# Patient Record
Sex: Male | Born: 1995 | Race: Black or African American | Hispanic: No | Marital: Single | Smoking: Current every day smoker
Health system: Southern US, Community
[De-identification: ages and names within clinical notes are randomized; demographics above are authoritative.]

## PROBLEM LIST (undated history)

## (undated) DIAGNOSIS — J45909 Unspecified asthma, uncomplicated: Secondary | ICD-10-CM

## (undated) HISTORY — PX: OTHER SURGICAL HISTORY: SHX169

## (undated) HISTORY — DX: Unspecified asthma, uncomplicated: J45.909

## (undated) HISTORY — PX: TONSILLECTOMY: SUR1361

---
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
## 2018-04-12 ENCOUNTER — Encounter (HOSPITAL_COMMUNITY): Payer: Self-pay

## 2018-04-12 ENCOUNTER — Ambulatory Visit (HOSPITAL_COMMUNITY)
Admission: EM | Admit: 2018-04-12 | Discharge: 2018-04-12 | Disposition: A | Discharge status: Home / Self Care | Payer: Self-pay | Attending: Family Medicine | Admitting: Family Medicine

## 2018-04-12 DIAGNOSIS — B353 Tinea pedis: Secondary | ICD-10-CM

## 2018-04-12 DIAGNOSIS — L03031 Cellulitis of right toe: Secondary | ICD-10-CM

## 2018-04-12 MED ORDER — CLOTRIMAZOLE 1 % EX CREA: TOPICAL_CREAM | CUTANEOUS | 0 refills | Status: DC

## 2018-04-12 MED ORDER — CEPHALEXIN 500 MG PO CAPS: 500.0000 mg | ORAL_CAPSULE | Freq: Four times a day (QID) | ORAL | 0 refills | Status: DC

## 2018-04-12 NOTE — ED Provider Notes (Signed)
[facility]    CSN: [id number] Arrival date & time: 04/12/18  4098     History   Chief Complaint Chief Complaint  Patient presents with  . Foot Pain    right foot    HPI Calvin Cummings is a 22 y.o. male.   Patient is a 22 year old male with no significant medical history.  He presents with 4 days of right great toe pain, redness, swelling and itchy rash to plantar aspect of foot and in between toes.  He reports a history of athlete's foot.  He has not been using any medication for the rash.  He denies any fever, chills, joint aches.  ROS per HPI      Past Medical History:  Diagnosis Date  . Asthma     Patient Active Problem List   Diagnosis Date Noted  . TINEA VERSICOLOR 06/02/2010  . ASTHMA, UNSPECIFIED 11/29/2006  . ECZEMA, ATOPIC DERMATITIS 11/29/2006    Past Surgical History:  Procedure Laterality Date  . arm fracture  Left   . FOREIGN BODY REMOVAL  08/24/2012   Procedure: REMOVAL FOREIGN BODY EXTREMITY;  Surgeon: Dominica Severin, MD;  Location: [facility] OR;  Service: Orthopedics;  Laterality: Right;  . TONSILLECTOMY         Home Medications    Prior to Admission medications   Medication Sig Start Date End Date Taking? Authorizing Provider  albuterol (PROVENTIL HFA;VENTOLIN HFA) 108 (90 BASE) MCG/ACT inhaler Inhale 2 puffs into the lungs every 6 (six) hours as needed for wheezing or shortness of breath.    [provider]  cephALEXin (KEFLEX) 500 MG capsule Take 1 capsule (500 mg total) by mouth 4 (four) times daily. 04/12/18   Dahlia Byes A, NP  clotrimazole (LOTRIMIN) 1 % cream Apply to affected area 2 times daily 04/12/18   Janace Aris, NP    Family History Family History  Problem Relation Age of Onset  . Asthma Mother   . Cancer Maternal Grandmother        breast  . Hypertension Maternal Grandfather     Social History Social History   Tobacco Use  . Smoking status: Current Every Day Smoker  . Smokeless tobacco: Never Used    Substance Use Topics  . Alcohol use: No  . Drug use: No     Allergies   Patient has no known allergies.   Review of Systems Review of Systems   Physical Exam Triage Vital Signs ED Triage Vitals  Enc Vitals Group     BP 04/12/18 0827 (!) 125/59     Pulse Rate 04/12/18 0827 62     Resp 04/12/18 0827 16     Temp 04/12/18 0827 98.4 F (36.9 C)     Temp Source 04/12/18 0827 Oral     SpO2 04/12/18 0827 99 %     Weight --      Height --      Head Circumference --      Peak Flow --      Pain Score 04/12/18 0829 8     Pain Loc --      Pain Edu? --      Excl. in GC? --    No data found.  Updated Vital Signs BP (!) 125/59 (BP Location: Right Arm)   Pulse 62   Temp 98.4 F (36.9 C) (Oral)   Resp 16   SpO2 99%   Visual Acuity Right Eye Distance:   Left Eye Distance:   Bilateral Distance:  Right Eye Near:   Left Eye Near:    Bilateral Near:     Physical Exam  Constitutional: He is oriented to person, place, and time. He appears well-developed and well-nourished.  HENT:  Head: Normocephalic.  Pulmonary/Chest: Effort normal.  Neurological: He is alert and oriented to person, place, and time.  Skin: Skin is warm and dry. Capillary refill takes less than 2 seconds.  Significant swelling, erythema, tenderness to right great toe. Scaly, circular rash to plantar aspect of foot, and in between toes on right foot with multiple cracks in foot.   Psychiatric: He has a normal mood and affect.  Nursing note and vitals reviewed.    UC Treatments / Results  Labs (all labs ordered are listed, but only abnormal results are displayed) Labs Reviewed - No data to display  EKG None  Radiology No results found.  Procedures Procedures (including critical care time)  Medications Ordered in UC Medications - No data to display  Initial Impression / Assessment and Plan / UC Course  I have reviewed the triage vital signs and the nursing notes.  Pertinent labs & imaging  results that were available during my care of the patient were reviewed by me and considered in my medical decision making (see chart for details).     Most likely fungal infection (athlete's foot).  I believe this has caused cellulitis infection in the right great toe.  Multiple cracks in feet were infection can enter.  Treating with fungal cream and antibiotics.  Return precautions given.  Final Clinical Impressions(s) / UC Diagnoses   Final diagnoses:  Athlete's foot on right  Cellulitis of toe of right foot     Discharge Instructions     It was nice meeting you!!  I am treating you for athletes foot and an infection in your toe. Please take the medication as prescribed.  Follow up for worsening symptoms.     ED Prescriptions    Medication Sig Dispense Auth. Provider   clotrimazole (LOTRIMIN) 1 % cream Apply to affected area 2 times daily 15 g Roxas Clymer A, NP   cephALEXin (KEFLEX) 500 MG capsule Take 1 capsule (500 mg total) by mouth 4 (four) times daily. 40 capsule Dahlia ByesBast, Naara Kelty A, NP     Controlled Substance Prescriptions [place] Controlled Substance Registry consulted? Not Applicable   Janace ArisBast, Dyllon Henken A, NP 04/12/18 1048

## 2018-04-12 NOTE — Discharge Instructions (Signed)
It was nice meeting you!!  I am treating you for athletes foot and an infection in your toe. Please take the medication as prescribed.  Follow up for worsening symptoms.

## 2018-04-12 NOTE — ED Triage Notes (Signed)
Pt presents with right foot pain

## 2020-02-20 ENCOUNTER — Emergency Department (HOSPITAL_COMMUNITY)
Admission: EM | Admit: 2020-02-20 | Discharge: 2020-02-20 | Disposition: A | Discharge status: Home / Self Care | Payer: Self-pay | Attending: Emergency Medicine | Admitting: Emergency Medicine

## 2020-02-20 ENCOUNTER — Encounter (HOSPITAL_COMMUNITY): Payer: Self-pay | Admitting: *Deleted

## 2020-02-20 ENCOUNTER — Emergency Department (HOSPITAL_COMMUNITY): Payer: Self-pay

## 2020-02-20 DIAGNOSIS — R1032 Left lower quadrant pain: Secondary | ICD-10-CM | POA: Insufficient documentation

## 2020-02-20 DIAGNOSIS — R1030 Lower abdominal pain, unspecified: Secondary | ICD-10-CM | POA: Insufficient documentation

## 2020-02-20 DIAGNOSIS — F17200 Nicotine dependence, unspecified, uncomplicated: Secondary | ICD-10-CM | POA: Insufficient documentation

## 2020-02-20 DIAGNOSIS — J45909 Unspecified asthma, uncomplicated: Secondary | ICD-10-CM | POA: Insufficient documentation

## 2020-02-20 LAB — CBC
HCT: 44.4 % (ref 39.0–52.0)
Hemoglobin: 14.3 g/dL (ref 13.0–17.0)
MCH: 27.6 pg (ref 26.0–34.0)
MCHC: 32.2 g/dL (ref 30.0–36.0)
MCV: 85.5 fL (ref 80.0–100.0)
Platelets: 287 10*3/uL (ref 150–400)
RBC: 5.19 MIL/uL (ref 4.22–5.81)
RDW: 13.7 % (ref 11.5–15.5)
WBC: 5 10*3/uL (ref 4.0–10.5)
nRBC: 0 % (ref 0.0–0.2)

## 2020-02-20 LAB — COMPREHENSIVE METABOLIC PANEL
ALT: 10 U/L (ref 0–44)
AST: 17 U/L (ref 15–41)
Albumin: 4.2 g/dL (ref 3.5–5.0)
Alkaline Phosphatase: 43 U/L (ref 38–126)
Anion gap: 9 (ref 5–15)
BUN: 7 mg/dL (ref 6–20)
CO2: 27 mmol/L (ref 22–32)
Calcium: 9.3 mg/dL (ref 8.9–10.3)
Chloride: 103 mmol/L (ref 98–111)
Creatinine, Ser: 1.19 mg/dL (ref 0.61–1.24)
GFR calc Af Amer: >60 mL/min (ref >60)
GFR calc non Af Amer: >60 mL/min (ref >60)
Glucose, Bld: 90 mg/dL (ref 70–99)
Potassium: 4 mmol/L (ref 3.5–5.1)
Sodium: 139 mmol/L (ref 135–145)
Total Bilirubin: 0.5 mg/dL (ref 0.3–1.2)
Total Protein: 6.9 g/dL (ref 6.5–8.1)

## 2020-02-20 LAB — URINALYSIS, ROUTINE W REFLEX MICROSCOPIC
Bilirubin Urine: NEGATIVE
Glucose, UA: NEGATIVE mg/dL
Hgb urine dipstick: NEGATIVE
Ketones, ur: NEGATIVE mg/dL
Nitrite: NEGATIVE
Protein, ur: NEGATIVE mg/dL
Specific Gravity, Urine: 1.02 (ref 1.005–1.030)
WBC, UA: >50 WBC/hpf — ABNORMAL HIGH (ref 0–5)
pH: 6 (ref 5.0–8.0)

## 2020-02-20 LAB — LIPASE, BLOOD: Lipase: 23 U/L (ref 11–51)

## 2020-02-20 MED ORDER — ONDANSETRON HCL 4 MG/2ML IJ SOLN
4.0000 mg | Freq: Once | INTRAMUSCULAR | Status: AC
  Administered 2020-02-20: 4 mg via INTRAVENOUS
  Filled 2020-02-20: qty 2

## 2020-02-20 MED ORDER — IOHEXOL 300 MG/ML  SOLN
100.0000 mL | Freq: Once | INTRAMUSCULAR | Status: AC | PRN
  Administered 2020-02-20: 100 mL via INTRAVENOUS

## 2020-02-20 MED ORDER — CEPHALEXIN 500 MG PO CAPS
500.0000 mg | ORAL_CAPSULE | Freq: Four times a day (QID) | ORAL | 0 refills | Status: DC
Start: 2020-02-20 — End: 2021-06-02

## 2020-02-20 MED ORDER — SODIUM CHLORIDE 0.9 % IV BOLUS
500.0000 mL | Freq: Once | INTRAVENOUS | Status: AC
  Administered 2020-02-20: 500 mL via INTRAVENOUS

## 2020-02-20 MED ORDER — HYDROMORPHONE HCL 1 MG/ML IJ SOLN
1.0000 mg | Freq: Once | INTRAMUSCULAR | Status: AC
  Administered 2020-02-20: 1 mg via INTRAVENOUS
  Filled 2020-02-20: qty 1

## 2020-02-20 MED ORDER — SODIUM CHLORIDE 0.9 % IV SOLN: INTRAVENOUS | Status: DC

## 2020-02-20 MED ORDER — HYDROCODONE-ACETAMINOPHEN 5-325 MG PO TABS: 1.0000 | ORAL_TABLET | Freq: Four times a day (QID) | ORAL | 0 refills | Status: DC | PRN

## 2020-02-20 MED ORDER — OXYCODONE-ACETAMINOPHEN 5-325 MG PO TABS
1.0000 | ORAL_TABLET | ORAL | Status: DC | PRN
  Administered 2020-02-20: 1 via ORAL
  Filled 2020-02-20: qty 1

## 2020-02-20 MED ORDER — SODIUM CHLORIDE 0.9% FLUSH: 3.0000 mL | Freq: Once | INTRAVENOUS | Status: DC

## 2020-02-20 NOTE — ED Provider Notes (Addendum)
[facility] EMERGENCY DEPARTMENT Provider Note   CSN: [id number] Arrival date & time: 02/20/20  0442     History Chief Complaint  Patient presents with  . Abdominal Pain    Calvin Cummings is a 24 y.o. male.  Patient with complaint of bilateral lower quadrant abdominal pain for 2 days.  Is been waxing and waning but has not gone away.  At times it is severe.  Not associated with any penile discharge or any dysuria or urinary frequency.  No nausea no vomiting no diarrhea.  Never had pain like this before.  No history of any injury.        Past Medical History:  Diagnosis Date  . Asthma     Patient Active Problem List   Diagnosis Date Noted  . TINEA VERSICOLOR 06/02/2010  . ASTHMA, UNSPECIFIED 11/29/2006  . ECZEMA, ATOPIC DERMATITIS 11/29/2006    Past Surgical History:  Procedure Laterality Date  . arm fracture  Left   . FOREIGN BODY REMOVAL  08/24/2012   Procedure: REMOVAL FOREIGN BODY EXTREMITY;  Surgeon: Roseanne Kaufman, MD;  Location: [place];  Service: Orthopedics;  Laterality: Right;  . TONSILLECTOMY         Family History  Problem Relation Age of Onset  . Asthma Mother   . Cancer Maternal Grandmother        breast  . Hypertension Maternal Grandfather     Social History   Tobacco Use  . Smoking status: Current Every Day Smoker  . Smokeless tobacco: Never Used  Substance Use Topics  . Alcohol use: No  . Drug use: No    Home Medications Prior to Admission medications   Medication Sig Start Date End Date Taking? Authorizing Provider  albuterol (PROVENTIL HFA;VENTOLIN HFA) 108 (90 BASE) MCG/ACT inhaler Inhale 2 puffs into the lungs every 6 (six) hours as needed for wheezing or shortness of breath.    [provider]  cephALEXin (KEFLEX) 500 MG capsule Take 1 capsule (500 mg total) by mouth 4 (four) times daily. Patient not taking: Reported on 02/20/2020 04/12/18   Loura Halt A, NP  clotrimazole (LOTRIMIN) 1 % cream Apply to  affected area 2 times daily Patient not taking: Reported on 02/20/2020 04/12/18   Loura Halt A, NP  HYDROcodone-acetaminophen (NORCO/VICODIN) 5-325 MG tablet Take 1 tablet by mouth every 6 (six) hours as needed for moderate pain. 02/20/20   Fredia Sorrow, MD    Allergies    Patient has no known allergies.  Review of Systems   Review of Systems  Constitutional: Negative for chills and fever.  HENT: Negative for congestion, rhinorrhea and sore throat.   Eyes: Negative for visual disturbance.  Respiratory: Negative for cough and shortness of breath.   Cardiovascular: Negative for chest pain and leg swelling.  Gastrointestinal: Positive for abdominal pain. Negative for diarrhea, nausea and vomiting.  Genitourinary: Negative for dysuria.  Musculoskeletal: Negative for back pain and neck pain.  Skin: Negative for rash.  Neurological: Negative for dizziness, light-headedness and headaches.  Hematological: Does not bruise/bleed easily.  Psychiatric/Behavioral: Negative for confusion.    Physical Exam Updated Vital Signs BP 124/75   Pulse (!) 41   Temp 98 F (36.7 C) (Oral)   Resp 14   Ht 1.676 m (5\' 6" )   Wt 65.8 kg   SpO2 100%   BMI 23.40 kg/m   Physical Exam Vitals and nursing note reviewed.  Constitutional:      Appearance: Normal appearance. He is well-developed.  HENT:     Head: Normocephalic and atraumatic.  Eyes:     Extraocular Movements: Extraocular movements intact.     Conjunctiva/sclera: Conjunctivae normal.     Pupils: Pupils are equal, round, and reactive to light.  Cardiovascular:     Rate and Rhythm: Normal rate and regular rhythm.     Heart sounds: No murmur.  Pulmonary:     Effort: Pulmonary effort is normal. No respiratory distress.     Breath sounds: Normal breath sounds.  Abdominal:     Palpations: Abdomen is soft.     Tenderness: There is abdominal tenderness. There is guarding.  Genitourinary:    Penis: Normal.   Musculoskeletal:         General: No swelling. Normal range of motion.     Cervical back: Normal range of motion and neck supple.  Skin:    General: Skin is warm and dry.  Neurological:     General: No focal deficit present.     Mental Status: He is alert and oriented to person, place, and time.     Cranial Nerves: No cranial nerve deficit.     Sensory: No sensory deficit.     Motor: No weakness.     ED Results / Procedures / Treatments   Labs (all labs ordered are listed, but only abnormal results are displayed) Labs Reviewed  URINALYSIS, ROUTINE W REFLEX MICROSCOPIC - Abnormal; Notable for the following components:      Result Value   APPearance HAZY (*)    Leukocytes,Ua MODERATE (*)    WBC, UA >50 (*)    Bacteria, UA RARE (*)    All other components within normal limits  LIPASE, BLOOD  COMPREHENSIVE METABOLIC PANEL  CBC    EKG None  Radiology CT Abdomen Pelvis W Contrast  Result Date: 02/20/2020 CLINICAL DATA:  Right lower quadrant abdominal pain. EXAM: CT ABDOMEN AND PELVIS WITH CONTRAST TECHNIQUE: Multidetector CT imaging of the abdomen and pelvis was performed using the standard protocol following bolus administration of intravenous contrast. CONTRAST:  OMNIPAQUE IOHEXOL 300 MG/ML  SOLN COMPARISON:  None. FINDINGS: Lower chest: Unremarkable. Hepatobiliary: No focal liver abnormality is seen. No gallstones, gallbladder wall thickening, or biliary dilatation. Pancreas: Unremarkable. No pancreatic ductal dilatation or surrounding inflammatory changes. Spleen: Normal in size without focal abnormality. Adrenals/Urinary Tract: Adrenal glands are unremarkable. Kidneys are normal, without renal calculi, focal lesion, or hydronephrosis. Bladder is unremarkable. Stomach/Bowel: Unremarkable stomach, small bowel and colon. No evidence of appendicitis. Vascular/Lymphatic: No significant vascular findings are present. No enlarged abdominal or pelvic lymph nodes. Reproductive: Prostate is unremarkable. Other:  No abdominal wall hernia or abnormality. No abdominopelvic ascites. Musculoskeletal: Normal appearing bones. IMPRESSION: Normal examination.  No evidence of appendicitis. Electronically Signed   By: Beckie Salts M.D.   On: 02/20/2020 09:12    Procedures Procedures (including critical care time)  Medications Ordered in ED Medications  sodium chloride flush (NS) 0.9 % injection 3 mL (3 mLs Intravenous Not Given 02/20/20 0819)  oxyCODONE-acetaminophen (PERCOCET/ROXICET) 5-325 MG per tablet 1 tablet (1 tablet Oral Given 02/20/20 0602)  0.9 %  sodium chloride infusion ( Intravenous New Bag/Given 02/20/20 1025)  sodium chloride 0.9 % bolus 500 mL (0 mLs Intravenous Stopped 02/20/20 1024)  ondansetron (ZOFRAN) injection 4 mg (4 mg Intravenous Given 02/20/20 0819)  HYDROmorphone (DILAUDID) injection 1 mg (1 mg Intravenous Given 02/20/20 0821)  iohexol (OMNIPAQUE) 300 MG/ML solution 100 mL (100 mLs Intravenous Contrast Given 02/20/20 0907)  HYDROmorphone (DILAUDID) injection 1  mg (1 mg Intravenous Given 02/20/20 1025)    ED Course  I have reviewed the triage vital signs and the nursing notes.  Pertinent labs & imaging results that were available during my care of the patient were reviewed by me and considered in my medical decision making (see chart for details).    MDM Rules/Calculators/A&P                      Patient's labs only significant for some white blood cells in the urine.  But no bacteria.  Nitrite negative.  Urine will be sent for culture.  No leukocytosis.  CT scan of the abdomen negative for any acute findings.  No penile discharge.  We will treat patient symptomatically with pain medication have him return if there is no improvement.  No acute findings despite his abdomen being fairly tender.  Since the white blood cell counts actually got greater than 50 but no bacteria we will go ahead and treat with Keflex empirically.  To see if he improves.   Final Clinical Impression(s) / ED  Diagnoses Final diagnoses:  Lower abdominal pain    Rx / DC Orders ED Discharge Orders         Ordered    HYDROcodone-acetaminophen (NORCO/VICODIN) 5-325 MG tablet  Every 6 hours PRN     02/20/20 1154           Vanetta Mulders, MD 02/20/20 1159    Vanetta Mulders, MD 02/20/20 1204

## 2020-02-20 NOTE — ED Notes (Signed)
Patient verbalizes understanding of discharge instructions. Opportunity for questioning and answers were provided. Pt discharged from ED. 

## 2020-02-20 NOTE — ED Triage Notes (Addendum)
To ED for eval of feeling like he has a knot in his abd. Describes as diffuse sharp pain. No vomiting. No diarrhea. Denies blood in stool. BMs normal per pt. Pt able to eat without increased pain. No pain with urination. States pain started 2 days - getting worse.

## 2020-02-20 NOTE — Discharge Instructions (Addendum)
Work-up for the abdominal pain without any significant findings.  Other than some white blood cells in the urine.  Urine been sent for culture if it grows bacteria suggestive of urinary tract infection you will be contacted.  CT scan of the abdomen without any significant findings inside the abdomen.  Take the pain medication as needed.  Return for any new or worse symptoms or if not improving over the next few days.  Take the antibiotic Keflex as directed for the next 7 days.

## 2020-02-21 LAB — URINE CULTURE: Culture: NO GROWTH

## 2020-10-10 ENCOUNTER — Emergency Department (HOSPITAL_COMMUNITY)
Admission: EM | Admit: 2020-10-10 | Discharge: 2020-10-10 | Disposition: A | Discharge status: Home / Self Care | Payer: Self-pay | Attending: Emergency Medicine | Admitting: Emergency Medicine

## 2020-10-10 ENCOUNTER — Other Ambulatory Visit: Payer: Self-pay

## 2020-10-10 DIAGNOSIS — Z113 Encounter for screening for infections with a predominantly sexual mode of transmission: Secondary | ICD-10-CM | POA: Insufficient documentation

## 2020-10-10 DIAGNOSIS — Z5321 Procedure and treatment not carried out due to patient leaving prior to being seen by health care provider: Secondary | ICD-10-CM | POA: Insufficient documentation

## 2020-10-10 DIAGNOSIS — R369 Urethral discharge, unspecified: Secondary | ICD-10-CM | POA: Insufficient documentation

## 2020-10-10 LAB — URINALYSIS, ROUTINE W REFLEX MICROSCOPIC
Bilirubin Urine: NEGATIVE
Glucose, UA: NEGATIVE mg/dL
Hgb urine dipstick: NEGATIVE
Ketones, ur: NEGATIVE mg/dL
Leukocytes,Ua: NEGATIVE
Nitrite: NEGATIVE
Protein, ur: NEGATIVE mg/dL
Specific Gravity, Urine: 1.018 (ref 1.005–1.030)
pH: 6 (ref 5.0–8.0)

## 2020-10-10 LAB — HIV ANTIBODY (ROUTINE TESTING W REFLEX): HIV Screen 4th Generation wRfx: NONREACTIVE

## 2020-10-10 MED ORDER — CEFTRIAXONE SODIUM 500 MG IJ SOLR: 500.0000 mg | Freq: Once | INTRAMUSCULAR | Status: DC

## 2020-10-10 MED ORDER — AZITHROMYCIN 250 MG PO TABS: 1000.0000 mg | ORAL_TABLET | Freq: Once | ORAL | Status: DC

## 2020-10-10 NOTE — ED Notes (Signed)
Called pt x2 for room, no response. 

## 2020-10-10 NOTE — ED Triage Notes (Signed)
Pt requesting STD check.  Reports penile discharge x 2 days.

## 2020-10-10 NOTE — ED Notes (Signed)
Called patient to move to Hallway bed, unable to locate at this time.

## 2020-10-22 ENCOUNTER — Encounter (HOSPITAL_COMMUNITY): Payer: Self-pay

## 2020-10-22 ENCOUNTER — Other Ambulatory Visit: Payer: Self-pay

## 2020-10-22 ENCOUNTER — Ambulatory Visit (INDEPENDENT_AMBULATORY_CARE_PROVIDER_SITE_OTHER): Payer: Self-pay

## 2020-10-22 ENCOUNTER — Ambulatory Visit (HOSPITAL_COMMUNITY)
Admission: EM | Admit: 2020-10-22 | Discharge: 2020-10-22 | Disposition: A | Discharge status: Home / Self Care | Payer: Self-pay | Attending: Urgent Care | Admitting: Urgent Care

## 2020-10-22 DIAGNOSIS — S6992XA Unspecified injury of left wrist, hand and finger(s), initial encounter: Secondary | ICD-10-CM

## 2020-10-22 DIAGNOSIS — M79645 Pain in left finger(s): Secondary | ICD-10-CM

## 2020-10-22 DIAGNOSIS — S60042A Contusion of left ring finger without damage to nail, initial encounter: Secondary | ICD-10-CM

## 2020-10-22 MED ORDER — NAPROXEN 500 MG PO TABS: 500.0000 mg | ORAL_TABLET | Freq: Two times a day (BID) | ORAL | 0 refills | Status: AC

## 2020-10-22 NOTE — ED Triage Notes (Signed)
Pt presents with pain limited ROM on left ring finger. States he slammed the left ring finger with a door around 2 am today.

## 2020-10-22 NOTE — ED Provider Notes (Signed)
[facility]   MRN: [id number] DOB: 18-Oct-1995  Subjective:   Apollos Tenbrink is a 25 y.o. male presenting for acute onset of left 4th finger injury. Patient was handling a crowbar against a wooden door at work, felt the door slamming and caught his left 4th finger at the tip. Heard a pop and felt pain. Has limited ROM. Has severe pain of distal finger.   No current facility-administered medications for this encounter.  Current Outpatient Medications:    albuterol (PROVENTIL HFA;VENTOLIN HFA) 108 (90 BASE) MCG/ACT inhaler, Inhale 2 puffs into the lungs every 6 (six) hours as needed for wheezing or shortness of breath., Disp: , Rfl:    cephALEXin (KEFLEX) 500 MG capsule, Take 1 capsule (500 mg total) by mouth 4 (four) times daily. (Patient not taking: No sig reported), Disp: 40 capsule, Rfl: 0   cephALEXin (KEFLEX) 500 MG capsule, Take 1 capsule (500 mg total) by mouth 4 (four) times daily., Disp: 28 capsule, Rfl: 0   clotrimazole (LOTRIMIN) 1 % cream, Apply to affected area 2 times daily (Patient not taking: No sig reported), Disp: 15 g, Rfl: 0   HYDROcodone-acetaminophen (NORCO/VICODIN) 5-325 MG tablet, Take 1 tablet by mouth every 6 (six) hours as needed for moderate pain., Disp: 10 tablet, Rfl: 0   No Known Allergies  Past Medical History:  Diagnosis Date   Asthma      Past Surgical History:  Procedure Laterality Date   arm fracture  Left    FOREIGN BODY REMOVAL  08/24/2012   Procedure: REMOVAL FOREIGN BODY EXTREMITY;  Surgeon: Dominica Severin, MD;  Location: [facility];  Service: Orthopedics;  Laterality: Right;   TONSILLECTOMY      Family History  Problem Relation Age of Onset   Asthma Mother    Cancer Maternal Grandmother        breast   Hypertension Maternal Grandfather     Social History   Tobacco Use   Smoking status: Current Every Day Smoker   Smokeless tobacco: Never Used  Substance Use Topics   Alcohol use: No   Drug use: No     ROS   Objective:   Vitals: BP 113/60 (BP Location: Left Arm)    Pulse 66    Temp 98.1 F (36.7 C) (Oral)    Resp 16    SpO2 98%   Physical Exam Constitutional:      General: He is not in acute distress.    Appearance: Normal appearance. He is well-developed and normal weight. He is not ill-appearing, toxic-appearing or diaphoretic.  HENT:     Head: Normocephalic and atraumatic.     Right Ear: External ear normal.     Left Ear: External ear normal.     Nose: Nose normal.     Mouth/Throat:     Pharynx: Oropharynx is clear.  Eyes:     General: No scleral icterus.       Right eye: No discharge.        Left eye: No discharge.     Extraocular Movements: Extraocular movements intact.     Pupils: Pupils are equal, round, and reactive to light.  Cardiovascular:     Rate and Rhythm: Normal rate.  Pulmonary:     Effort: Pulmonary effort is normal.  Musculoskeletal:       Hands:     Cervical back: Normal range of motion.  Neurological:     Mental Status: He is alert and oriented to person, place, and time.  Psychiatric:        Mood and Affect: Mood normal.        Behavior: Behavior normal.        Thought Content: Thought content normal.        Judgment: Judgment normal.     DG Finger Ring Left  Result Date: 10/22/2020 CLINICAL DATA:  Left finger pain EXAM: LEFT RING FINGER 2+V COMPARISON:  None. FINDINGS: No fracture or dislocation is seen. The joint spaces are preserved. Visualized soft tissues are within normal limits. IMPRESSION: Negative. Electronically Signed   By: Charline Bills M.D.   On: 10/22/2020 19:31    Assessment and Plan :   PDMP not reviewed this encounter.  1. Finger pain, left   2. Injury of finger of left hand, initial encounter   3. Contusion of left ring finger without damage to nail, initial encounter     Suspect finger contusion from the crush injury.  Recommended buddy tape system.  Use naproxen for pain and inflammation.  Follow-up with a  hand specialist if symptoms persist.  Information given to the patient. Counseled patient on potential for adverse effects with medications prescribed/recommended today, ER and return-to-clinic precautions discussed, patient verbalized understanding.    Wallis Bamberg, [place] 10/22/20 1946

## 2020-12-01 IMAGING — CT CT ABD-PELV W/ CM
2 of 4 series · 16 of 46 positions shown, 18 images · IV contrast (Omni 300)
Comparison: None.

CLINICAL DATA: Right lower quadrant abdominal pain.

EXAM:
CT ABDOMEN AND PELVIS WITH CONTRAST
TECHNIQUE: Multidetector CT imaging of the abdomen and pelvis was performed
using the standard protocol following bolus administration of
intravenous contrast.
CONTRAST:  100mL OMNIPAQUE IOHEXOL 300 MG/ML  SOLN

[Series 3: a/p w/ 5mm · axial · 0.66mm/px · z∈[+640,+1040]mm · 13 of 88 slices shown, 15 images]
[im 4/88  soft-tissue]
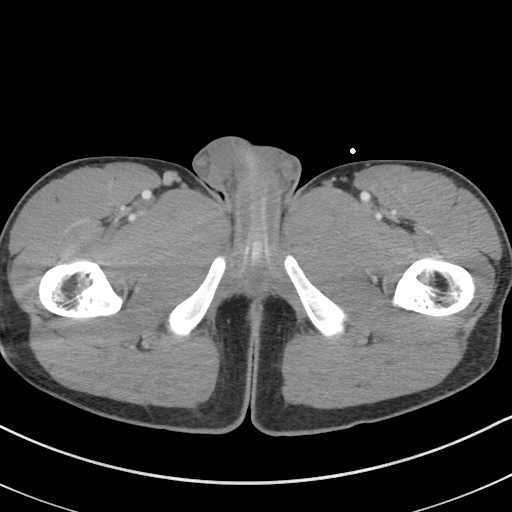
[im 4/88  bone]
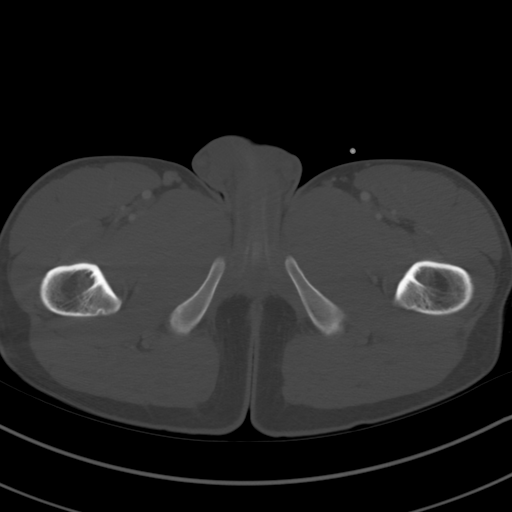
[im 11/88  soft-tissue]
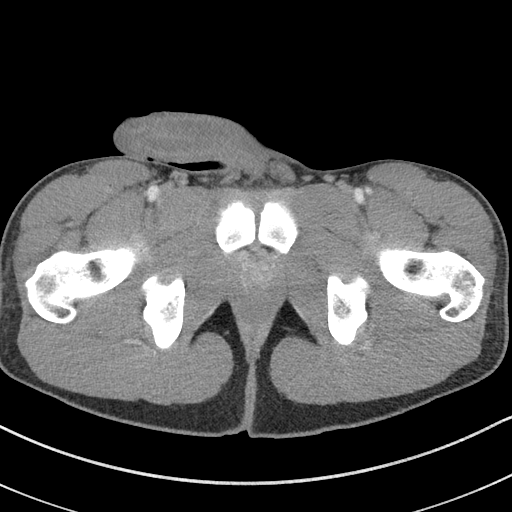
[im 18/88  soft-tissue]
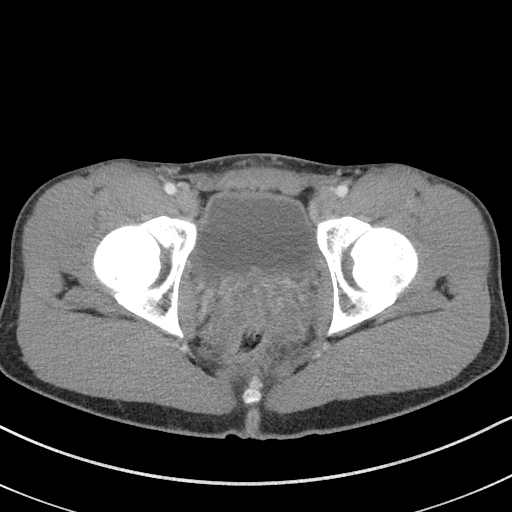
[im 25/88  soft-tissue]
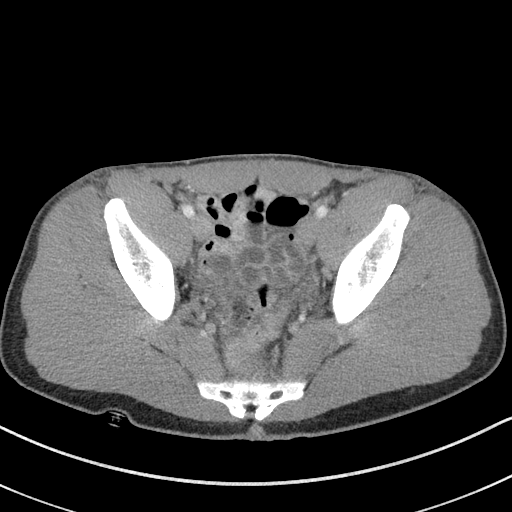
[im 32/88  soft-tissue]
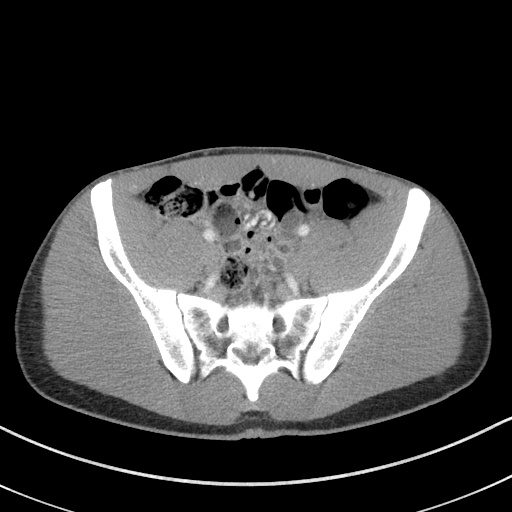
[im 39/88  soft-tissue]
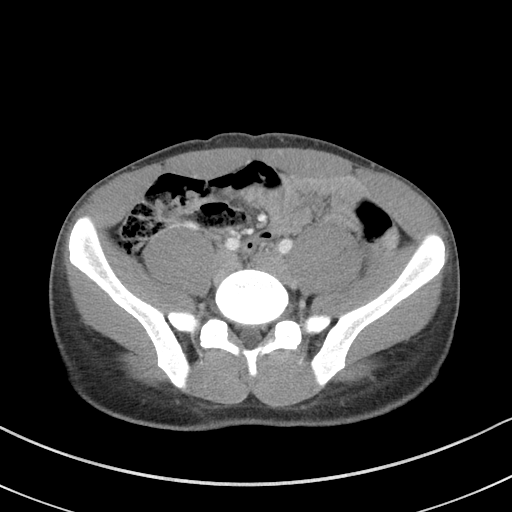
[im 46/88  soft-tissue]
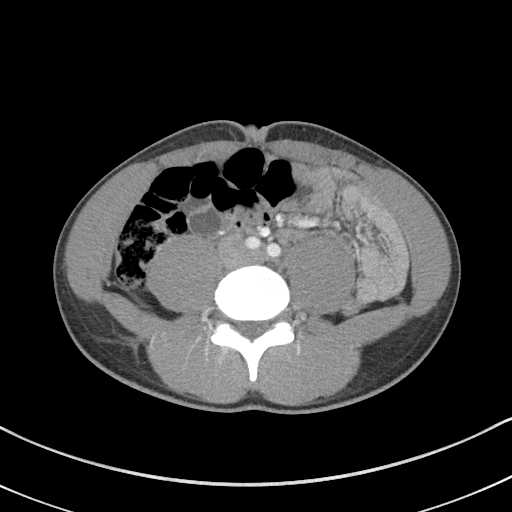
[im 49/88  soft-tissue]
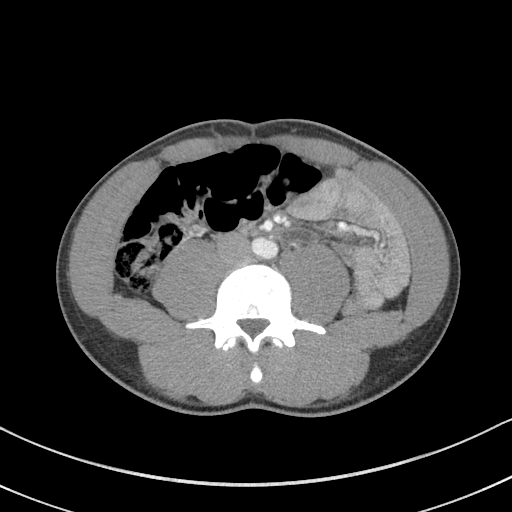
[im 56/88  soft-tissue]
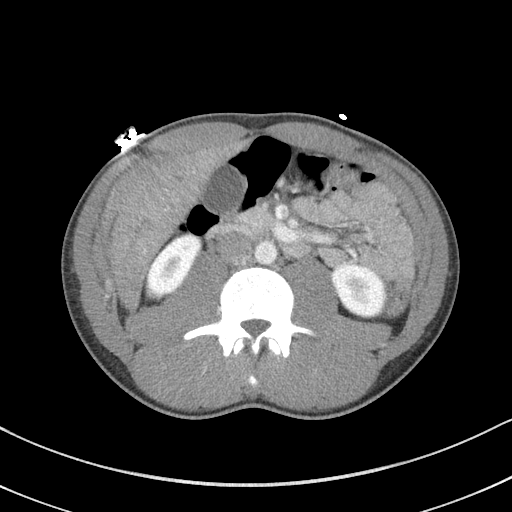
[im 56/88  bone]
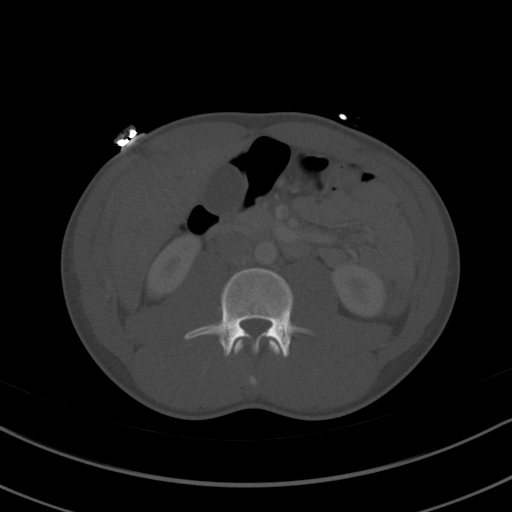
[im 63/88  soft-tissue]
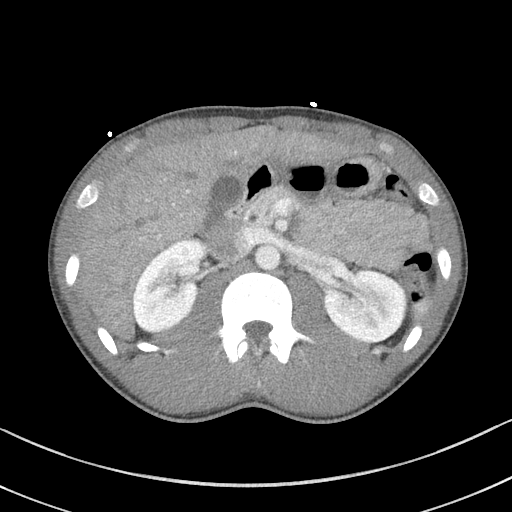
[im 70/88  soft-tissue]
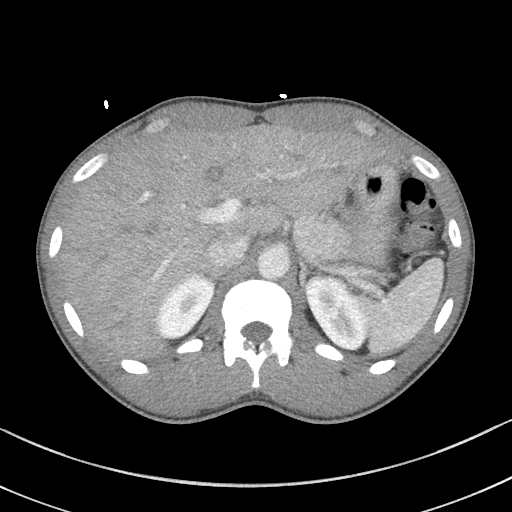
[im 77/88  soft-tissue]
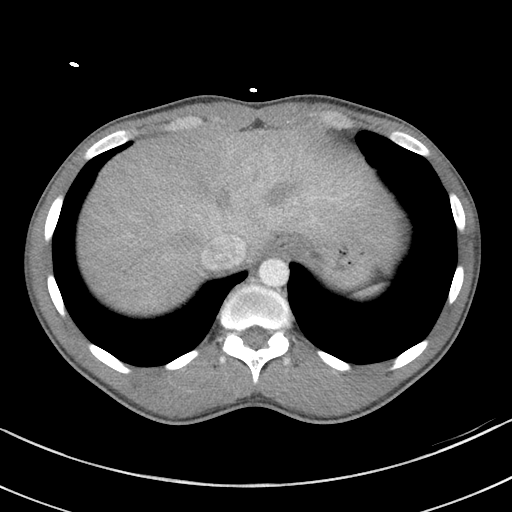
[im 84/88  soft-tissue]
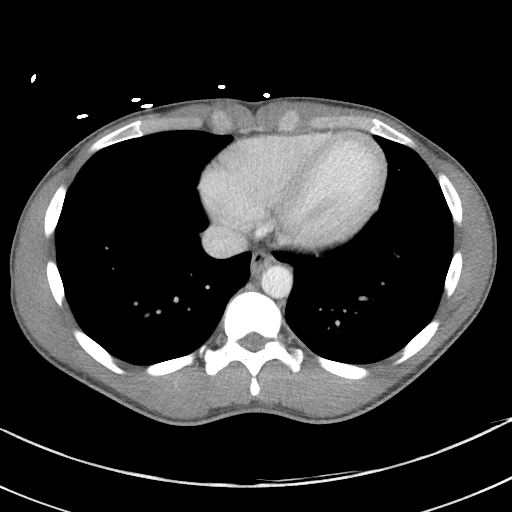

[Series 6: a/p w/ cor · coronal · 0.70mm/px · 3 of 116 slices shown]
[im 39/116  soft-tissue]
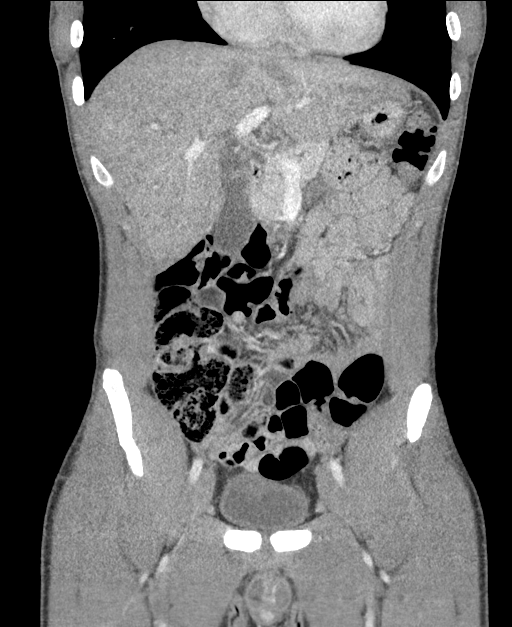
[im 52/116  soft-tissue]
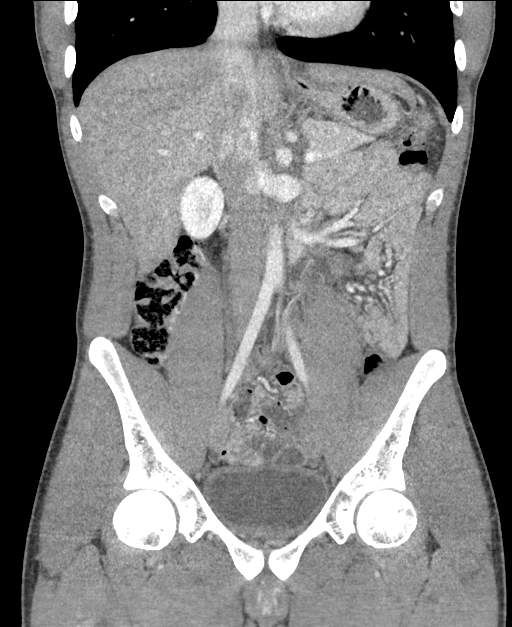
[im 64/116  soft-tissue]
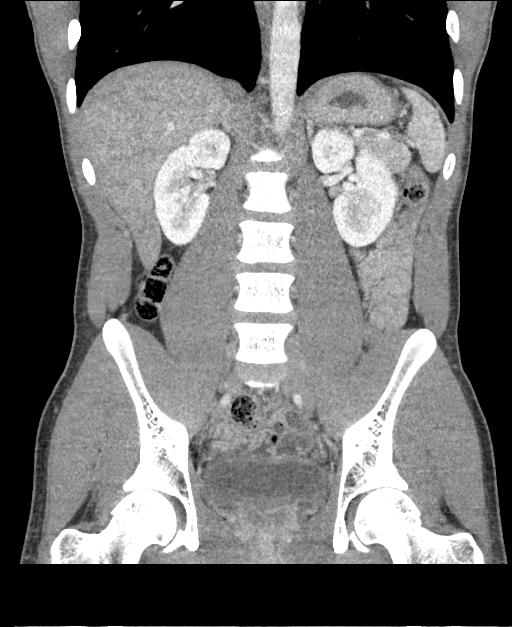

[16 of 46 positions shown; findings below may reference images not displayed]

FINDINGS: Lower chest: Unremarkable.

Hepatobiliary: No focal liver abnormality is seen. No gallstones,
gallbladder wall thickening, or biliary dilatation.

Pancreas: Unremarkable. No pancreatic ductal dilatation or
surrounding inflammatory changes.

Spleen: Normal in size without focal abnormality.

Adrenals/Urinary Tract: Adrenal glands are unremarkable. Kidneys are
normal, without renal calculi, focal lesion, or hydronephrosis.
Bladder is unremarkable.

Stomach/Bowel: Unremarkable stomach, small bowel and colon. No
evidence of appendicitis.

Vascular/Lymphatic: No significant vascular findings are present. No
enlarged abdominal or pelvic lymph nodes.

Reproductive: Prostate is unremarkable.

Other: No abdominal wall hernia or abnormality. No abdominopelvic
ascites.

Musculoskeletal: Normal appearing bones.
IMPRESSION: Normal examination.  No evidence of appendicitis.

## 2021-06-01 ENCOUNTER — Encounter (HOSPITAL_BASED_OUTPATIENT_CLINIC_OR_DEPARTMENT_OTHER): Payer: Self-pay

## 2021-06-01 ENCOUNTER — Other Ambulatory Visit: Payer: Self-pay

## 2021-06-01 DIAGNOSIS — F17200 Nicotine dependence, unspecified, uncomplicated: Secondary | ICD-10-CM | POA: Insufficient documentation

## 2021-06-01 DIAGNOSIS — J45909 Unspecified asthma, uncomplicated: Secondary | ICD-10-CM | POA: Insufficient documentation

## 2021-06-01 DIAGNOSIS — Z202 Contact with and (suspected) exposure to infections with a predominantly sexual mode of transmission: Secondary | ICD-10-CM | POA: Insufficient documentation

## 2021-06-01 NOTE — ED Triage Notes (Signed)
Patient here POV from Home for STD Assessment.  Patient states he has only had one sexual partner with "Baby Momma". She states she has BV and advised him to be tested.  Patient has no Symptoms. No Fevers, No Itching, No Burning, No Discharge.

## 2021-06-02 ENCOUNTER — Emergency Department (HOSPITAL_BASED_OUTPATIENT_CLINIC_OR_DEPARTMENT_OTHER)
Admission: EM | Admit: 2021-06-02 | Discharge: 2021-06-02 | Disposition: A | Discharge status: Home / Self Care | Payer: Self-pay | Attending: Emergency Medicine | Admitting: Emergency Medicine

## 2021-06-02 DIAGNOSIS — Z202 Contact with and (suspected) exposure to infections with a predominantly sexual mode of transmission: Secondary | ICD-10-CM

## 2021-06-02 LAB — HIV ANTIBODY (ROUTINE TESTING W REFLEX): HIV Screen 4th Generation wRfx: NONREACTIVE

## 2021-06-02 MED ORDER — CEFTRIAXONE SODIUM 500 MG IJ SOLR
500.0000 mg | Freq: Once | INTRAMUSCULAR | Status: AC
  Administered 2021-06-02: 500 mg via INTRAMUSCULAR
  Filled 2021-06-02: qty 500

## 2021-06-02 NOTE — Discharge Instructions (Addendum)
If you have not done so, sign up for MyChart. You will be able to check your test results on MyChart. They should be available in 2-3 days.

## 2021-06-02 NOTE — ED Provider Notes (Signed)
[facility] EMERGENCY DEPT Provider Note   CSN: [id number] Arrival date & time: 06/01/21  2023     History Chief Complaint  Patient presents with   Exposure to STD    Calvin Cummings is a 25 y.o. male.  The history is provided by the patient.  Exposure to STD He states that his sexual partner informed him that she had tested positive for gonorrhea and received an injection today.  He denies any dysuria or urethral discharge.  He denies other sexual partners.   Past Medical History:  Diagnosis Date   Asthma     Patient Active Problem List   Diagnosis Date Noted   TINEA VERSICOLOR 06/02/2010   ASTHMA, UNSPECIFIED 11/29/2006   ECZEMA, ATOPIC DERMATITIS 11/29/2006    Past Surgical History:  Procedure Laterality Date   arm fracture  Left    FOREIGN BODY REMOVAL  08/24/2012   Procedure: REMOVAL FOREIGN BODY EXTREMITY;  Surgeon: Dominica Severin, MD;  Location: [facility];  Service: Orthopedics;  Laterality: Right;   TONSILLECTOMY         Family History  Problem Relation Age of Onset   Asthma Mother    Cancer Maternal Grandmother        breast   Hypertension Maternal Grandfather     Social History   Tobacco Use   Smoking status: Every Day   Smokeless tobacco: Never  Substance Use Topics   Alcohol use: Yes    Comment: Socially   Drug use: No    Home Medications Prior to Admission medications   Medication Sig Start Date End Date Taking? Authorizing Provider  albuterol (PROVENTIL HFA;VENTOLIN HFA) 108 (90 BASE) MCG/ACT inhaler Inhale 2 puffs into the lungs every 6 (six) hours as needed for wheezing or shortness of breath.    [provider]  naproxen (NAPROSYN) 500 MG tablet Take 1 tablet (500 mg total) by mouth 2 (two) times daily with a meal. 10/22/20   Wallis Bamberg, PA-C    Allergies    Patient has no known allergies.  Review of Systems   Review of Systems  All other systems reviewed and are negative.  Physical Exam Updated Vital  Signs BP 123/82 (BP Location: Right Arm)   Pulse 66   Temp 98.6 F (37 C) (Oral)   Resp 16   Ht 5\' 6"  (1.676 m)   Wt 65.8 kg   SpO2 100%   BMI 23.41 kg/m   Physical Exam Vitals and nursing note reviewed.  25 year old male, resting comfortably and in no acute distress. Vital signs are normal. Oxygen saturation is 100%, which is normal. Head is normocephalic and atraumatic. PERRLA, EOMI. Oropharynx is clear. Neck is nontender and supple without adenopathy or JVD. Back is nontender and there is no CVA tenderness. Lungs are clear without rales, wheezes, or rhonchi. Chest is nontender. Heart has regular rate and rhythm without murmur. Abdomen is soft, flat, nontender. Genitalia: Circumcised penis.  Testes descended without masses.  No urethral discharge seen.  Shotty bilateral inguinal adenopathy present. Extremities have no cyanosis or edema, full range of motion is present. Skin is warm and dry without rash. Neurologic: Mental status is normal, cranial nerves are intact, moves all extremities equally.  ED Results / Procedures / Treatments   Labs (all labs ordered are listed, but only abnormal results are displayed) Labs Reviewed  RPR  HIV ANTIBODY (ROUTINE TESTING W REFLEX)  GC/CHLAMYDIA PROBE AMP ([place]) NOT AT [facility]   Procedures Procedures   Medications  Ordered in ED Medications  cefTRIAXone (ROCEPHIN) injection 500 mg (has no administration in time range)    ED Course  I have reviewed the triage vital signs and the nursing notes.  Pertinent lab results that were available during my care of the patient were reviewed by me and considered in my medical decision making (see chart for details).   MDM Rules/Calculators/A&P                         Exposure to gonorrhea.  Old records were reviewed, and he has no has a prior ED visit for urethritis, positive test for gonorrhea in 2017 and 2018.  He is given an injection of ceftriaxone.  STI panel sent.  Advised on safe  sex practices.  Final Clinical Impression(s) / ED Diagnoses Final diagnoses:  Exposure to gonorrhea    Rx / DC Orders ED Discharge Orders     None        Dione Booze, MD 06/02/21 [phone]

## 2021-06-03 LAB — GC/CHLAMYDIA PROBE AMP (~~LOC~~) NOT AT ARMC
Chlamydia: NEGATIVE
Comment: NEGATIVE
Comment: NORMAL
Neisseria Gonorrhea: POSITIVE — AB

## 2021-06-03 LAB — RPR: RPR Ser Ql: NONREACTIVE

## 2021-08-03 IMAGING — DX DG FINGER RING 2+V*L*
3 series · 3 of 3 positions shown · non-contrast
Comparison: None.

CLINICAL DATA: Left finger pain

EXAM:
LEFT RING FINGER 2+V

[finger ap]
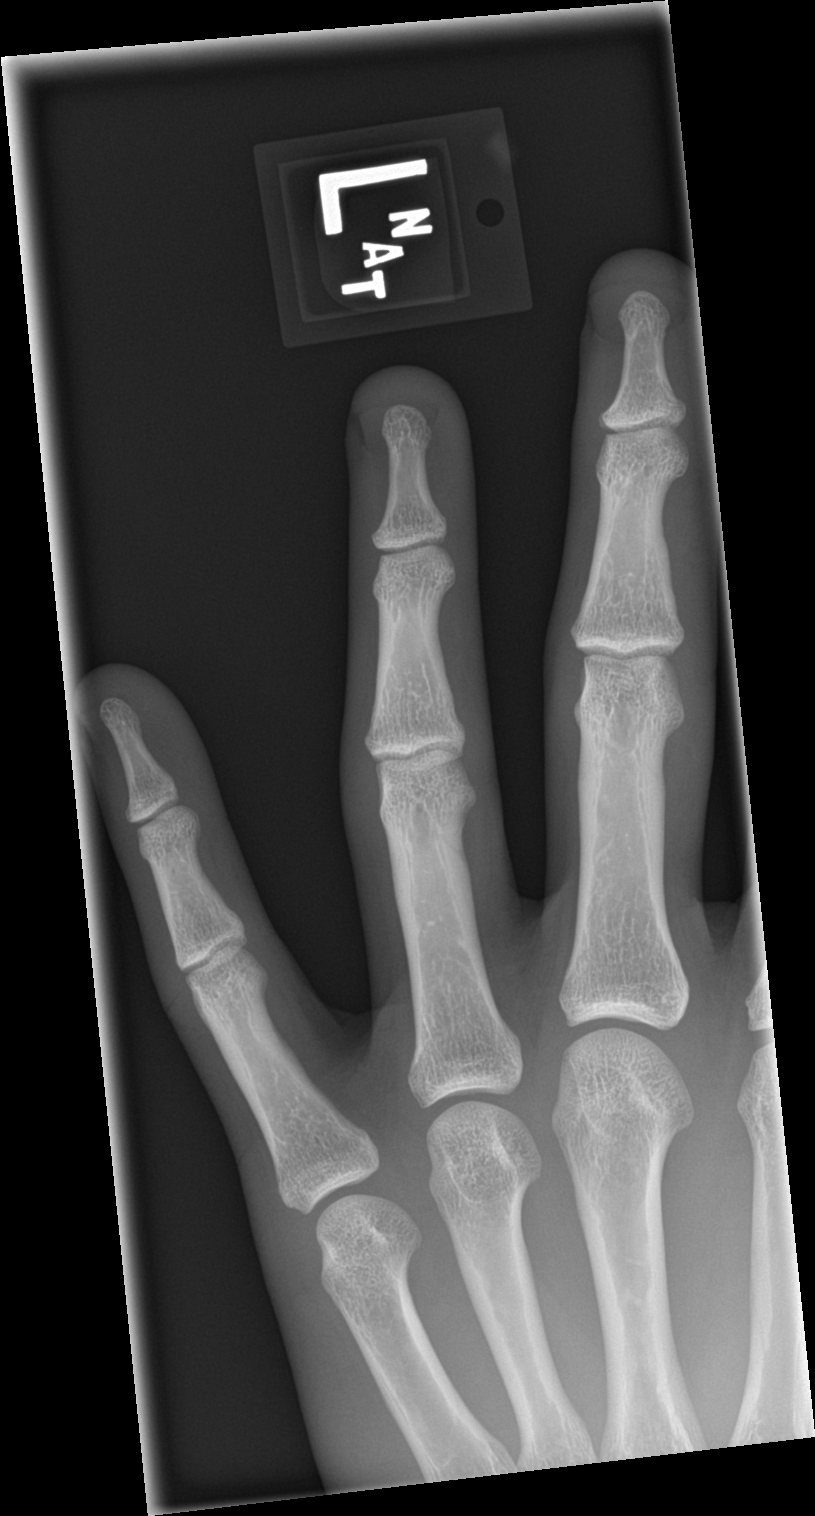

[finger obl]
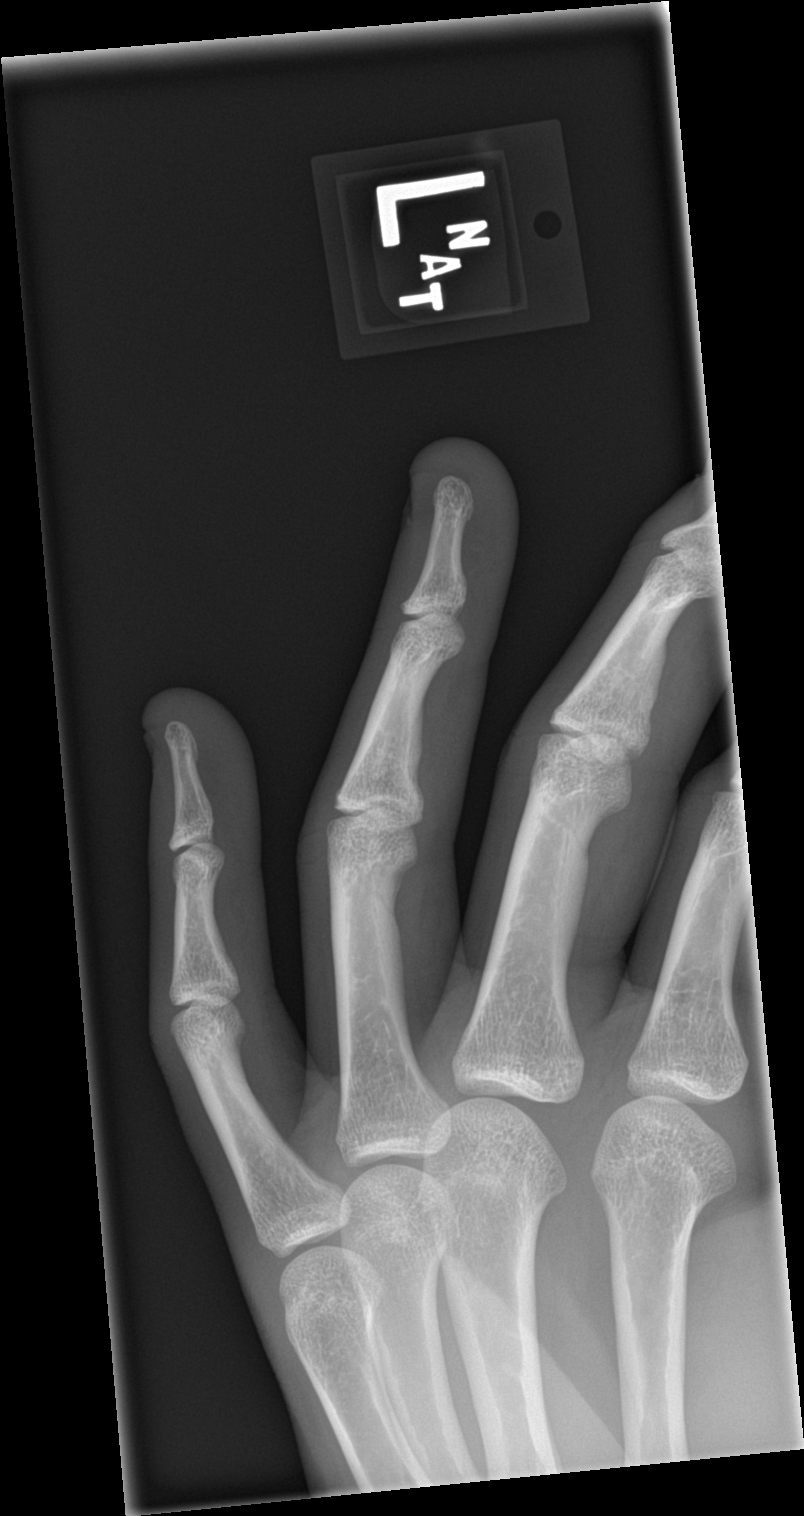

[finger lat]
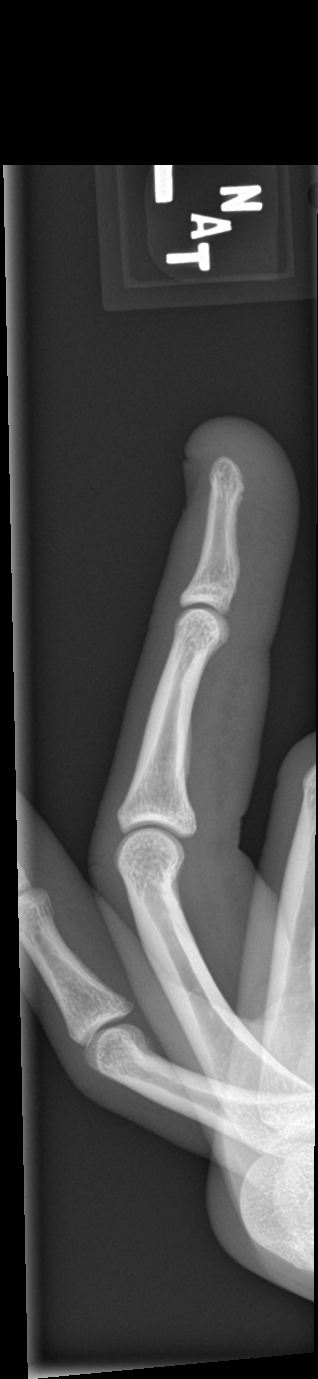

[3 of 3 positions shown; findings below may reference images not displayed]

FINDINGS: No fracture or dislocation is seen.

The joint spaces are preserved.

Visualized soft tissues are within normal limits.
IMPRESSION: Negative.
